# Patient Record
Sex: Male | Born: 2005 | Race: White | Hispanic: No | Marital: Single | Smoking: Never smoker
Health system: Southern US, Community
[De-identification: ages and names within clinical notes are randomized; demographics above are authoritative.]

## PROBLEM LIST (undated history)

## (undated) DIAGNOSIS — S060X9A Concussion with loss of consciousness of unspecified duration, initial encounter: Secondary | ICD-10-CM

## (undated) DIAGNOSIS — R109 Unspecified abdominal pain: Secondary | ICD-10-CM

## (undated) HISTORY — DX: Unspecified abdominal pain: R10.9

## (undated) HISTORY — PX: MYRINGOTOMY: SUR874

---
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
## 2024-03-24 ENCOUNTER — Ambulatory Visit (INDEPENDENT_AMBULATORY_CARE_PROVIDER_SITE_OTHER): Admitting: Gastroenterology

## 2024-03-24 ENCOUNTER — Encounter: Payer: Self-pay | Admitting: Gastroenterology

## 2024-03-24 DIAGNOSIS — K200 Eosinophilic esophagitis: Secondary | ICD-10-CM

## 2024-03-24 DIAGNOSIS — K2100 Gastro-esophageal reflux disease with esophagitis, without bleeding: Secondary | ICD-10-CM

## 2024-03-24 NOTE — Progress Notes (Unsigned)
 Chief Complaint:    Eosinophilic Esophagitis  GI History: Jose Mcdonald is a 18 year old male with a known history of EOE, and has been following with Pediatric GI at [facility], most recently seen 09/24/2022.  Previous records reviewed and notable for the following: -10/11/2020: Initial appointment with Pediatric GI for evaluation of reflux symptoms and dysphagia - 10/27/2020: EGD: Normal duodenal biopsies, normal gastric biopsies.  52 eosinophils/hpf in distal esophagus, 85 in mid esophagus, 20 in proximal esophagus all consistent with EOE.  Started Flovent and omeprazole  40 mg bid - 11/21/2020: Follow-up in peds GI clinic.  Improvement in heartburn and dysphagia. - 04/12/2021: EGD (on Flovent bid and PPI bid): Normal duodenum with normal biopsies.  Mild non-H. pylori gastritis.  Mild erythema near GEJ.  50 eosinophils/hpf in distal esophagus with normal biopsies in the mid and proximal esophagus - 05/02/2021: Follow-up in peds GI clinic.  Still overall improvement.  Stopped Flovent and continued high-dose PPI - 06/29/2021: Dysphagia symptoms started to recur (vs pill esophagitis from doxycycline ).  Continued high-dose PPI and restarted Flovent x 1 month - 09/20/2021: Started Dupixent  - 01/03/2022: Follow-up in peds GI clinic.  Had breakthrough reflux symptoms with PPI hold.  Continued Dupixent .  Continued high-dose PPI for 1 more month - 02/22/2022: EGD (while on Dupixent ; stopped PPI 1 week prior): Normal-appearing esophagus, stomach, duodenum.  Normal duodenal biopsies.  Esophageal biopsies with 4 eos/hpf in the distal esophagus, and normal biopsies from the mid and proximal esophagus - 03/15/2023: Initial appointment in the [facility] clinic - 04/24/2023: EGD: LA Grade D erosive esophagitis, longitudinal furrows and exudates in esophagus with positive tug sign on biopsies (path: Rare eosinophils in the distal esophagus consistent with treated EOE with normal upper esophagus).  Dilated with 16 mm Savary dilator.   Otherwise normal stomach and duodenum (duodenal path benign).  Prescribed Protonix  for esophagitis and continued Dupixent   HPI:     Patient is a 18 y.o. male presenting to the Gastroenterology Clinic for follow-up.   He stopped Dupixent  mid March. Eliminattion diet for only 2 weeks, and now on full PO intake and no sxs.   Not on omeprazole . No breakthough reflux sxs.   Has since titrated off all medications and just doing elimination diet only for his EOE.  Had been off PPI for several months, and stopped Dupixent  in March.  Started with for food elimination diet with illumination of milk/dairy, egg, soy/legumes, and wheat.  Transferring to [employer] in [place], [place].     Review of systems:     No chest pain, no SOB, no fevers, no urinary sx   Past Medical History:  Diagnosis Date   Abdominal pain    [place] 2018   [place]     Patient's surgical history, family medical history, social history, medications and allergies were all reviewed in Epic    Current Outpatient Medications  Medication Sig Dispense Refill   CREATINE PO Take 1 Dose by mouth daily.     No current facility-administered medications for this visit.    Physical Exam:     BP 110/70 (BP Location: Left Arm, Patient Position: Sitting, Cuff Size: Large)   Pulse (!) 56   Ht 5' 11.25 (1.81 m) Comment: height measured without shoes  Wt 181 lb 6 oz (82.3 kg)   BMI 25.12 kg/m   GENERAL:  Pleasant *** in NAD PSYCH: : Cooperative, normal affect EENT:  conjunctiva pink, mucous membranes moist, neck supple without masses CARDIAC:  RRR, ***murmur heard,  no peripheral edema PULM: Normal respiratory effort, lungs CTA bilaterally, no wheezing ABDOMEN:  Nondistended, soft, nontender. No obvious masses, no hepatomegaly,  normal bowel sounds SKIN:  turgor, no lesions seen Musculoskeletal:  Normal muscle tone, normal strength NEURO: Alert and oriented x 3, no focal neurologic deficits   IMPRESSION and PLAN:     1)   He would like to remain off EOE therapy if possible.  - EGD now to evaluate for esophageal eosinophilia.  If elevated eosinophils, may need to consider restarting Dupixent , PPI, or trialing elimination diet (although suspect this would be difficult while in college).  If no significant esophageal eosinophilia, we will continue with conservative measures per patient preference - Will tentatively schedule EGD for 04/21/2024, to be done prior to heading off to [place]  for school in mid August.  He will discuss with his parents and let me know if this works  From a reflux standpoint, he has not had any breakthrough reflux symptoms.  Similar to EOE, he would like to remain off any acid suppression therapy if possible. - EGD as above.  If erosive esophagitis, may need to consider restarting at least low-dose PPI from esophageal protection standpoint, but may also suffice for treating EOE              Sandor LULLA Flatter ,DO, FACG 03/24/2024, 3:31 PM

## 2024-03-24 NOTE — Patient Instructions (Addendum)
 _______________________________________________________  If your blood pressure at your visit was 140/90 or greater, please contact your primary care physician to follow up on this.  _______________________________________________________  If you are age 19 or older, your body mass index should be between 23-30. Your Body mass index is 25.12 kg/m. If this is out of the aforementioned range listed, please consider follow up with your Primary Care Provider.  If you are age 43 or younger, your body mass index should be between 19-25. Your Body mass index is 25.12 kg/m. If this is out of the aformentioned range listed, please consider follow up with your Primary Care Provider.   ________________________________________________________  The [facility] providers would like to encourage you to use MYCHART to communicate with providers for non-urgent requests or questions.  Due to long hold times on the telephone, sending your provider a message by [facility] : [facility] may be a faster and more efficient way to get a response.  Please allow 48 business hours for a response.  Please remember that this is for non-urgent requests.  _______________________________________________________  Rosine have been scheduled for an endoscopy. Please follow written instructions given to you at your visit today.  If you use inhalers (even only as needed), please bring them with you on the day of your procedure.  If you take any of the following medications, they will need to be adjusted prior to your procedure:   DO NOT TAKE 7 DAYS PRIOR TO TEST- Trulicity (dulaglutide) Ozempic, Wegovy (semaglutide) Mounjaro (tirzepatide) Bydureon Bcise (exanatide extended release)  DO NOT TAKE 1 DAY PRIOR TO YOUR TEST Rybelsus (semaglutide) Adlyxin (lixisenatide) Victoza (liraglutide) Byetta (exanatide) ___________________________________________________________________________  Due to recent changes in healthcare laws, you may see the  results of your imaging and laboratory studies on MyChart before your provider has had a chance to review them.  We understand that in some cases there may be results that are confusing or concerning to you. Not all laboratory results come back in the same time frame and the provider may be waiting for multiple results in order to interpret others.  Please give us  48 hours in order for your provider to thoroughly review all the results before contacting the office for clarification of your results.   It was a pleasure to see you today!  Vito Cirigliano, D.O.

## 2024-04-03 ENCOUNTER — Encounter: Payer: Self-pay | Admitting: Gastroenterology

## 2024-04-14 ENCOUNTER — Encounter: Payer: Self-pay | Admitting: Gastroenterology

## 2024-04-21 ENCOUNTER — Encounter: Payer: Self-pay | Admitting: Gastroenterology

## 2024-04-21 ENCOUNTER — Ambulatory Visit (AMBULATORY_SURGERY_CENTER): Admitting: Gastroenterology

## 2024-04-21 VITALS — BP 98/49 | HR 49 | Temp 97.8°F | Resp 12 | Ht 71.0 in | Wt 181.0 lb

## 2024-04-21 DIAGNOSIS — K2970 Gastritis, unspecified, without bleeding: Secondary | ICD-10-CM | POA: Diagnosis present

## 2024-04-21 DIAGNOSIS — K200 Eosinophilic esophagitis: Secondary | ICD-10-CM

## 2024-04-21 DIAGNOSIS — K2100 Gastro-esophageal reflux disease with esophagitis, without bleeding: Secondary | ICD-10-CM

## 2024-04-21 MED ORDER — SODIUM CHLORIDE 0.9 % IV SOLN: 500.0000 mL | Freq: Once | INTRAVENOUS | Status: DC

## 2024-04-21 MED ORDER — PANTOPRAZOLE SODIUM 40 MG PO TBEC: 40.0000 mg | DELAYED_RELEASE_TABLET | Freq: Two times a day (BID) | ORAL | 5 refills | Status: AC

## 2024-04-21 NOTE — Progress Notes (Signed)
 Report to PACU, RN, vss, BBS= Clear.

## 2024-04-21 NOTE — Progress Notes (Signed)
 Called to room to assist during endoscopic procedure.  Patient ID and intended procedure confirmed with present staff. Received instructions for my participation in the procedure from the performing physician.

## 2024-04-21 NOTE — Patient Instructions (Addendum)
 START taking Pantoprazole  40 mg Twice daily.  Prescription has been sent to your preferred pharmacy.   YOU HAD AN ENDOSCOPIC PROCEDURE TODAY AT THE [facility]:   Refer to the procedure report that was given to you for any specific questions about what was found during the examination.  If the procedure report does not answer your questions, please call your gastroenterologist to clarify.  If you requested that your care partner not be given the details of your procedure findings, then the procedure report has been included in a sealed envelope for you to review at your convenience later.  YOU SHOULD EXPECT: Some feelings of bloating in the abdomen. Passage of more gas than usual.  Walking can help get rid of the air that was put into your GI tract during the procedure and reduce the bloating. If you had a lower endoscopy (such as a colonoscopy or flexible sigmoidoscopy) you may notice spotting of blood in your stool or on the toilet paper. If you underwent a bowel prep for your procedure, you may not have a normal bowel movement for a few days.  Please Note:  You might notice some irritation and congestion in your nose or some drainage.  This is from the oxygen used during your procedure.  There is no need for concern and it should clear up in a day or so.  SYMPTOMS TO REPORT IMMEDIATELY:  Following upper endoscopy (EGD)  Vomiting of blood or coffee ground material  New chest pain or pain under the shoulder blades  Painful or persistently difficult swallowing  New shortness of breath  Fever of 100F or higher  Black, tarry-looking stools  For urgent or emergent issues, a gastroenterologist can be reached at any hour by calling [phone]. Do not use MyChart messaging for urgent concerns.    DIET:  We do recommend a small meal at first, but then you may proceed to your regular diet.  Drink plenty of fluids but you should avoid alcoholic beverages for 24 hours.  ACTIVITY:  You  should plan to take it easy for the rest of today and you should NOT DRIVE or use heavy machinery until tomorrow (because of the sedation medicines used during the test).    FOLLOW UP: Our staff will call the number listed on your records the next business day following your procedure.  We will call around 7:15- 8:00 am to check on you and address any questions or concerns that you may have regarding the information given to you following your procedure. If we do not reach you, we will leave a message.     If any biopsies were taken you will be contacted by phone or by letter within the next 1-3 weeks.  Please call us  at [phone] if you have not heard about the biopsies in 3 weeks.    SIGNATURES/CONFIDENTIALITY: You and/or your care partner have signed paperwork which will be entered into your electronic medical record.  These signatures attest to the fact that that the information above on your After Visit Summary has been reviewed and is understood.  Full responsibility of the confidentiality of this discharge information lies with you and/or your care-partner.

## 2024-04-21 NOTE — Progress Notes (Signed)
 GASTROENTEROLOGY PROCEDURE H&P NOTE   Primary Care Physician: Katrinka Garnette KIDD, MD    Reason for Procedure:  Eosinophilic esophagitis, GERD  Plan:    EGD  Patient is appropriate for endoscopic procedure(s) in the ambulatory ([facility]) setting.  The nature of the procedure, as well as the risks, benefits, and alternatives were carefully and thoroughly reviewed with the patient. Ample time for discussion and questions allowed. The patient understood, was satisfied, and agreed to proceed.     HPI: Jose Mcdonald is a 18 y.o. male who presents for EGD for ongoing evaluation of EOE.  Not currently on any EOE treatment.  Previously treated with Dupixent  (stopped mid March on his own).  Does have coexisting GERD, previously on PPI, which he also stopped on his own several months ago.  Currently without symptoms.  No significant changes in clinical history since last OV with me on 03/24/2024.  Please refer to that note for additional details.  Past Medical History:  Diagnosis Date   Abdominal pain    [place]     Past Surgical History:  Procedure Laterality Date   MYRINGOTOMY      Prior to Admission medications   Medication Sig Start Date End Date Taking? Authorizing Provider  CREATINE PO Take 1 Dose by mouth daily.   Yes [provider]    Current Outpatient Medications  Medication Sig Dispense Refill   CREATINE PO Take 1 Dose by mouth daily.     Current Facility-Administered Medications  Medication Dose Route Frequency Provider Last Rate Last Admin   0.9 %  sodium chloride  infusion  500 mL Intravenous Once Yuya Vanwingerden V, DO        Allergies as of 04/21/2024   (No Known Allergies)    Family History  Problem Relation Age of Onset   Asthma Mother    Hyperlipidemia Father    Colon polyps Father        first adenomatous polyp age 69- Dr. Rosalie   Hypertension Maternal Grandmother    CAD Maternal Grandfather        in 67s    Hypertension Maternal Grandfather    Breast cancer Paternal Grandmother        54   Colon cancer Paternal Grandmother        10   Pancreatic cancer Paternal Grandmother    Depression Paternal Grandfather    Prostate cancer Paternal Grandfather        37   Stomach cancer Neg Hx    Esophageal cancer Neg Hx     Social History   Socioeconomic History   Marital status: Single    Spouse name: Not on file   Number of children: 0   Years of education: Not on file   Highest education level: Not on file  Occupational History   Occupation: student  Tobacco Use   Smoking status: Never   Smokeless tobacco: Never  Vaping Use   Vaping status: Never Used  Substance and Sexual Activity   Alcohol use: Never   Drug use: Never   Sexual activity: Not on file  Other Topics Concern   Not on file  Social History Narrative   [place] with mom, dad, brother      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  Plans to pursue biology/medicine.    -graduate [employer]      Hobbies: time on computer (PC gamer- mine craft, first person shooter)- built his own Science [employer].  Good swimmer - knees limit other sports.    -working at [employer] in summer 2024   Social Drivers of Health   Financial Resource Strain: Not on file  Food Insecurity: Not on file  Transportation Needs: Not on file  Physical Activity: Not on file  Stress: Not on file  Social Connections: Not on file  Intimate Partner Violence: Not on file    Physical Exam: Vital signs in last 24 hours: @BP  121/72   Pulse (!) 45   Temp 97.8 F (36.6 C) (Temporal)   Ht 5' 11 (1.803 m)   Wt 181 lb (82.1 kg)   SpO2 99%   BMI 25.24 kg/m  GEN: NAD EYE: Sclerae anicteric ENT: MMM CV: Non-tachycardic Pulm: CTA b/l GI: Soft, NT/ND NEURO:  Alert & Oriented x 3   Sandor Flatter, DO [facility]   04/21/2024 1:46 PM

## 2024-04-21 NOTE — Op Note (Signed)
 [facility] Patient Name: Jose Mcdonald Procedure Date: 04/21/2024 1:38 PM MRN: [id number] Endoscopist: Sandor Flatter , MD, [id number] Age: 18 Referring MD:  Date of Birth: 18-Jun-2006 Gender: Male Account #: [id number] Procedure:                Upper GI endoscopy Indications:              Follow-up of reflux esophagitis, Follow-up of                            eosinophilic esophagitis Medicines:                Monitored Anesthesia Care Procedure:                Pre-Anesthesia Assessment:                           - Prior to the procedure, a History and Physical                            was performed, and patient medications and                            allergies were reviewed. The patient's tolerance of                            previous anesthesia was also reviewed. The risks                            and benefits of the procedure and the sedation                            options and risks were discussed with the patient.                            All questions were answered, and informed consent                            was obtained. Prior Anticoagulants: The patient has                            taken no anticoagulant or antiplatelet agents. ASA                            Grade Assessment: I - A normal, healthy patient.                            After reviewing the risks and benefits, the patient                            was deemed in satisfactory condition to undergo the                            procedure.  After obtaining informed consent, the endoscope was                            passed under direct vision. Throughout the                            procedure, the patient's blood pressure, pulse, and                            oxygen saturations were monitored continuously. The                            Olympus Scope SN [id number] was introduced through the                            mouth, and advanced to the second part of  duodenum.                            The upper GI endoscopy was accomplished without                            difficulty. The patient tolerated the procedure                            well. Scope In: Scope Out: Findings:                 Mucosal changes including feline appearance,                            longitudinal furrows and white plaques were found                            in the middle third of the esophagus and in the                            lower third of the esophagus. Esophageal findings                            were graded using the Eosinophilic Esophagitis                            Endoscopic Reference Score (EoE-EREFS) as: Edema                            Grade 1 Present (decreased clarity or absence of                            vascular markings), Rings Grade 0 None (no ridges                            or rings seen), Exudates Grade 1 Mild (scattered  white lesions involving less than 10 percent of the                            esophageal surface area), Furrows Grade 1 Mild                            (vertical lines without visible depth) and                            Stricture none (no stricture found). Biopsies were                            obtained from the proximal and distal esophagus                            with cold forceps for histology of suspected                            eosinophilic esophagitis. Estimated blood loss was                            minimal.                           The Z-line was regular and was found 41 cm from the                            incisors.                           Scattered mild inflammation characterized by                            erythema was found in the gastric fundus, in the                            gastric body, at the incisura and in the gastric                            antrum. Biopsies were taken with a cold forceps for                            Helicobacter pylori  testing. Estimated blood loss                            was minimal.                           The examined duodenum was normal. Complications:            No immediate complications. Estimated Blood Loss:     Estimated blood loss was minimal. Impression:               - Esophageal mucosal changes consistent with  eosinophilic esophagitis. Biopsies were taken with                            a cold forceps for evaluation of eosinophilic                            esophagitis.                           - Z-line regular, 41 cm from the incisors.                           - Mild, non-ulcer gastritis. Biopsied.                           - Normal examined duodenum. Recommendation:           - Patient has a contact number available for                            emergencies. The signs and symptoms of potential                            delayed complications were discussed with the                            patient. Return to normal activities tomorrow.                            Written discharge instructions were provided to the                            patient.                           - Advance diet as tolerated.                           - Await pathology results.                           - Depending on pathology results, we will discuss                            the role of restarting medical therapy for                            Eosinophilic Esophagitis, to include either                            restarting pantoprazole  or Dupixent . Sandor Flatter, MD 04/21/2024 2:10:21 PM

## 2024-04-22 ENCOUNTER — Telehealth: Payer: Self-pay | Admitting: Lactation Services

## 2024-04-22 NOTE — Telephone Encounter (Signed)
 No answer left voice mail

## 2024-04-23 LAB — SURGICAL PATHOLOGY

## 2024-04-24 ENCOUNTER — Ambulatory Visit: Payer: Self-pay | Admitting: Gastroenterology
